# Patient Record
Sex: Female | Born: 1970 | Race: White | Hispanic: No | Smoking: Current every day smoker
Health system: Southern US, Community
[De-identification: ages and names within clinical notes are randomized; demographics above are authoritative.]

## PROBLEM LIST (undated history)

## (undated) DIAGNOSIS — T50902A Poisoning by unspecified drugs, medicaments and biological substances, intentional self-harm, initial encounter: Secondary | ICD-10-CM

## (undated) DIAGNOSIS — N200 Calculus of kidney: Secondary | ICD-10-CM

## (undated) DIAGNOSIS — F32A Depression, unspecified: Secondary | ICD-10-CM

## (undated) DIAGNOSIS — F329 Major depressive disorder, single episode, unspecified: Secondary | ICD-10-CM

## (undated) DIAGNOSIS — T71162A Asphyxiation due to hanging, intentional self-harm, initial encounter: Secondary | ICD-10-CM

## (undated) HISTORY — PX: APPENDECTOMY: SHX54

## (undated) HISTORY — PX: CHOLECYSTECTOMY: SHX55

---

## 2011-11-03 ENCOUNTER — Encounter: Payer: Self-pay | Admitting: *Deleted

## 2011-11-03 ENCOUNTER — Emergency Department (HOSPITAL_COMMUNITY)
Admission: EM | Admit: 2011-11-03 | Discharge: 2011-11-03 | Disposition: A | Discharge status: Home / Self Care | Payer: Self-pay | Attending: Emergency Medicine | Admitting: Emergency Medicine

## 2011-11-03 DIAGNOSIS — R51 Headache: Secondary | ICD-10-CM | POA: Insufficient documentation

## 2011-11-03 DIAGNOSIS — F329 Major depressive disorder, single episode, unspecified: Secondary | ICD-10-CM | POA: Insufficient documentation

## 2011-11-03 DIAGNOSIS — F3289 Other specified depressive episodes: Secondary | ICD-10-CM | POA: Insufficient documentation

## 2011-11-03 DIAGNOSIS — R443 Hallucinations, unspecified: Secondary | ICD-10-CM | POA: Insufficient documentation

## 2011-11-03 DIAGNOSIS — Z8659 Personal history of other mental and behavioral disorders: Secondary | ICD-10-CM | POA: Insufficient documentation

## 2011-11-03 DIAGNOSIS — R440 Auditory hallucinations: Secondary | ICD-10-CM

## 2011-11-03 DIAGNOSIS — Z79899 Other long term (current) drug therapy: Secondary | ICD-10-CM | POA: Insufficient documentation

## 2011-11-03 HISTORY — DX: Calculus of kidney: N20.0

## 2011-11-03 MED ORDER — BUPROPION HCL ER (SR) 100 MG PO TB12: 100.0000 mg | ORAL_TABLET | ORAL | Status: DC

## 2011-11-03 MED ORDER — ACETAMINOPHEN 325 MG PO TABS
650.0000 mg | ORAL_TABLET | Freq: Once | ORAL | Status: AC
  Administered 2011-11-03: 650 mg via ORAL
  Filled 2011-11-03 (×2): qty 1

## 2011-11-03 MED ORDER — LORAZEPAM 1 MG PO TABS: 0.5000 mg | ORAL_TABLET | Freq: Three times a day (TID) | ORAL | Status: AC

## 2011-11-03 MED ORDER — CHLORPROMAZINE HCL 50 MG PO TABS: 300.0000 mg | ORAL_TABLET | Freq: Three times a day (TID) | ORAL | Status: AC

## 2011-11-03 MED ORDER — LORAZEPAM 1 MG PO TABS
0.5000 mg | ORAL_TABLET | Freq: Once | ORAL | Status: AC
  Administered 2011-11-03: 0.5 mg via ORAL
  Filled 2011-11-03: qty 1

## 2011-11-03 NOTE — ED Provider Notes (Signed)
History     CSN: [id number] Arrival date & time: 11/03/2011 12:12 PM   First MD Initiated Contact with Patient 11/03/11 1357      Chief Complaint  Patient presents with  . Medical Clearance    (Consider location/radiation/quality/duration/timing/severity/associated sxs/prior treatment) HPI Patient complains of hallucinations for approximately 3 weeks,. She stopped taking her psychiatric medications 2 months ago. Auditory hallucinations. Has auditory hallucinations 03 men consulting her. One man telling her to hang herself, although patient started the amount he denies that she would harm herself or others. Also complains of diffuse headache which she gets when she's under emotional stress. Past Medical History  Diagnosis Date  . Kidney stones   . Migraine    Borderline personality disorder, schizophrenia History reviewed. No pertinent past surgical history.  History reviewed. No pertinent family history.  History  Substance Use Topics  . Smoking status: Current Everyday Smoker -- 1.0 packs/day  . Smokeless tobacco: Not on file  . Alcohol Use: No    OB History    Grav Para Term Preterm Abortions TAB SAB Ect Mult Living                  Review of Systems  Constitutional: Negative.   Respiratory: Negative.   Cardiovascular: Negative.   Gastrointestinal: Negative.   Musculoskeletal: Negative.   Skin: Negative.   Neurological: Positive for headaches.  Hematological: Negative.   Psychiatric/Behavioral: Positive for hallucinations.    Allergies  Review of patient's allergies indicates no known allergies.  Home Medications   Current Outpatient Rx  Name Route Sig Dispense Refill  . TRAMADOL HCL 50 MG PO TABS Oral Take 50 mg by mouth every 6 (six) hours as needed. Maximum dose= 8 tablets per day For pain       BP 131/91  Pulse 77  Temp(Src) 97.6 F (36.4 C) (Oral)  Resp 20  SpO2 100%  Physical Exam  Nursing note and vitals reviewed. Constitutional: She is  oriented to person, place, and time.  Neurological: She is oriented to person, place, and time. Coordination normal.  Psychiatric: Judgment normal.       Mildly depressed affect    ED Course  Procedures (including critical care time)  Labs Reviewed - No data to display No results found.   No diagnosis found.    MDM    Spoke with hospital pharmacist. Plan Prescription: Thorazine 300 mg 3 times daily#90 Wellbutrin 300 mg once daily#30 Ativan 0.5 mg 3 times a day as needed for anxiety#20    Referral [facility] Diagnosis auditory hallucinations  Doug Sou, MD 11/03/11 1432

## 2011-11-03 NOTE — ED Notes (Signed)
Reports being off psych meds x 2 months, hearing voices x 1 week, having hallucinations. Denies SI or HI.

## 2014-03-09 ENCOUNTER — Inpatient Hospital Stay: Payer: Self-pay | Admitting: Psychiatry

## 2014-10-13 ENCOUNTER — Encounter (HOSPITAL_COMMUNITY): Payer: Self-pay | Admitting: Emergency Medicine

## 2014-10-13 ENCOUNTER — Emergency Department (HOSPITAL_COMMUNITY): Payer: Self-pay

## 2014-10-13 ENCOUNTER — Emergency Department (HOSPITAL_COMMUNITY)
Admission: EM | Admit: 2014-10-13 | Discharge: 2014-10-14 | Disposition: A | Discharge status: Other Acute Inpatient Hospital | Payer: Self-pay | Attending: Emergency Medicine | Admitting: Emergency Medicine

## 2014-10-13 DIAGNOSIS — F32A Depression, unspecified: Secondary | ICD-10-CM | POA: Diagnosis present

## 2014-10-13 DIAGNOSIS — R0789 Other chest pain: Secondary | ICD-10-CM | POA: Insufficient documentation

## 2014-10-13 DIAGNOSIS — G43909 Migraine, unspecified, not intractable, without status migrainosus: Secondary | ICD-10-CM | POA: Insufficient documentation

## 2014-10-13 DIAGNOSIS — F209 Schizophrenia, unspecified: Secondary | ICD-10-CM | POA: Diagnosis present

## 2014-10-13 DIAGNOSIS — Z87442 Personal history of urinary calculi: Secondary | ICD-10-CM | POA: Insufficient documentation

## 2014-10-13 DIAGNOSIS — Z720 Tobacco use: Secondary | ICD-10-CM | POA: Insufficient documentation

## 2014-10-13 DIAGNOSIS — T50902A Poisoning by unspecified drugs, medicaments and biological substances, intentional self-harm, initial encounter: Secondary | ICD-10-CM | POA: Diagnosis present

## 2014-10-13 DIAGNOSIS — F329 Major depressive disorder, single episode, unspecified: Secondary | ICD-10-CM | POA: Insufficient documentation

## 2014-10-13 DIAGNOSIS — Z79899 Other long term (current) drug therapy: Secondary | ICD-10-CM | POA: Insufficient documentation

## 2014-10-13 DIAGNOSIS — F603 Borderline personality disorder: Secondary | ICD-10-CM | POA: Diagnosis present

## 2014-10-13 DIAGNOSIS — F1410 Cocaine abuse, uncomplicated: Secondary | ICD-10-CM | POA: Insufficient documentation

## 2014-10-13 HISTORY — DX: Asphyxiation due to hanging, intentional self-harm, initial encounter: T71.162A

## 2014-10-13 HISTORY — DX: Depression, unspecified: F32.A

## 2014-10-13 HISTORY — DX: Poisoning by unspecified drugs, medicaments and biological substances, intentional self-harm, initial encounter: T50.902A

## 2014-10-13 HISTORY — DX: Major depressive disorder, single episode, unspecified: F32.9

## 2014-10-13 LAB — RAPID URINE DRUG SCREEN, HOSP PERFORMED
AMPHETAMINES: NOT DETECTED
BARBITURATES: NOT DETECTED
BENZODIAZEPINES: NOT DETECTED
COCAINE: NOT DETECTED
OPIATES: POSITIVE — AB
TETRAHYDROCANNABINOL: NOT DETECTED

## 2014-10-13 LAB — COMPREHENSIVE METABOLIC PANEL
ALBUMIN: 3.3 g/dL — AB (ref 3.5–5.2)
ALK PHOS: 80 U/L (ref 39–117)
ALT: 17 U/L (ref 0–35)
ANION GAP: 12 (ref 5–15)
AST: 20 U/L (ref 0–37)
BUN: 13 mg/dL (ref 6–23)
CHLORIDE: 107 meq/L (ref 96–112)
CO2: 24 mEq/L (ref 19–32)
Calcium: 8.8 mg/dL (ref 8.4–10.5)
Creatinine, Ser: 0.73 mg/dL (ref 0.50–1.10)
GFR calc Af Amer: >90 mL/min (ref >90)
GFR calc non Af Amer: >90 mL/min (ref >90)
Glucose, Bld: 91 mg/dL (ref 70–99)
POTASSIUM: 3.7 meq/L (ref 3.7–5.3)
SODIUM: 143 meq/L (ref 137–147)
TOTAL PROTEIN: 7.5 g/dL (ref 6.0–8.3)

## 2014-10-13 LAB — SALICYLATE LEVEL: Salicylate Lvl: <2 mg/dL — ABNORMAL LOW (ref 2.8–20.0)

## 2014-10-13 LAB — CBC
HEMATOCRIT: 38.8 % (ref 36.0–46.0)
Hemoglobin: 12.8 g/dL (ref 12.0–15.0)
MCH: 31 pg (ref 26.0–34.0)
MCHC: 33 g/dL (ref 30.0–36.0)
MCV: 93.9 fL (ref 78.0–100.0)
Platelets: 263 10*3/uL (ref 150–400)
RBC: 4.13 MIL/uL (ref 3.87–5.11)
RDW: 14.4 % (ref 11.5–15.5)
WBC: 9.2 10*3/uL (ref 4.0–10.5)

## 2014-10-13 LAB — ETHANOL: Alcohol, Ethyl (B): <11 mg/dL (ref 0–11)

## 2014-10-13 LAB — ACETAMINOPHEN LEVEL: Acetaminophen (Tylenol), Serum: <15 ug/mL (ref 10–30)

## 2014-10-13 LAB — TROPONIN I: Troponin I: <0.3 ng/mL (ref <0.30)

## 2014-10-13 MED ORDER — ACETAMINOPHEN 500 MG PO TABS
1000.0000 mg | ORAL_TABLET | Freq: Once | ORAL | Status: AC
  Administered 2014-10-13: 1000 mg via ORAL
  Filled 2014-10-13: qty 2

## 2014-10-13 MED ORDER — SODIUM CHLORIDE 0.9 % IV BOLUS (SEPSIS)
1000.0000 mL | Freq: Once | INTRAVENOUS | Status: AC
  Administered 2014-10-13: 1000 mL via INTRAVENOUS

## 2014-10-13 MED ORDER — ACETAMINOPHEN 325 MG PO TABS: 650.0000 mg | ORAL_TABLET | ORAL | Status: DC | PRN

## 2014-10-13 MED ORDER — IBUPROFEN 200 MG PO TABS: 600.0000 mg | ORAL_TABLET | Freq: Three times a day (TID) | ORAL | Status: DC | PRN

## 2014-10-13 MED ORDER — ONDANSETRON HCL 4 MG/2ML IJ SOLN
4.0000 mg | Freq: Once | INTRAMUSCULAR | Status: AC
  Administered 2014-10-13: 4 mg via INTRAVENOUS
  Filled 2014-10-13: qty 2

## 2014-10-13 MED ORDER — NICOTINE 21 MG/24HR TD PT24: 21.0000 mg | MEDICATED_PATCH | Freq: Every day | TRANSDERMAL | Status: DC

## 2014-10-13 NOTE — ED Notes (Signed)
Per EMS pt took 10, 300 mg Seroquel in a suicide attempt.  Pt has multiple suicide attempts including trying to hang herself as well as liquefying all medications in her home and attempting to inject them into her veins resulting in poor venous access.  Pt endorsed chest tightness en route however 12 lead revealed NSR.  Pt is A&O x 3.  Per EMS pt has also been taking a family members morphine pills recently however did not take one today.

## 2014-10-13 NOTE — ED Notes (Signed)
Bed: ZO10WA16 Expected date:  Expected time:  Means of arrival:  Comments: EMS 43yo F overdose on Seroquel / chest pain / shob

## 2014-10-13 NOTE — ED Provider Notes (Signed)
CSN: [id number]     Arrival date & time 10/13/14  2058 History   First MD Initiated Contact with Patient 10/13/14 2102     Chief Complaint  Patient presents with  . Suicide Attempt     (Consider location/radiation/quality/duration/timing/severity/associated sxs/prior Treatment) HPI 43 year old female presents approximately 1-2 hours after intentionally taking 10 300 mg Seroquel's in a suicide attempt. Patient has attempted suicide multiple times. She states she is too depressed to live. She has been admitted to many psychiatric hospitals before. The patient states right now she feels tired and has "cottonmouth". The patient also feels nauseous but denies abdominal pain. Are partially 30 minutes after taking this she felt chest tightness. She states she thinks this was from stress from arguing with her boyfriend. She does not have any chest tightness at this time.  Past Medical History  Diagnosis Date  . Kidney stones   . Migraine   . Suicide attempt by hanging   . Suicide and self-inflicted poisoning by drugs and medicinal substances   . Depression    Past Surgical History  Procedure Laterality Date  . Cholecystectomy    . Appendectomy    . Cesarean section     History reviewed. No pertinent family history. History  Substance Use Topics  . Smoking status: Current Every Day Smoker -- 1.00 packs/day  . Smokeless tobacco: Not on file  . Alcohol Use: No   OB History   Grav Para Term Preterm Abortions TAB SAB Ect Mult Living                 Review of Systems  Respiratory: Positive for chest tightness.   Gastrointestinal: Positive for nausea. Negative for vomiting and abdominal pain.  Neurological: Negative for headaches.  Psychiatric/Behavioral: Positive for suicidal ideas and dysphoric mood.  All other systems reviewed and are negative.     Allergies  Review of patient's allergies indicates no known allergies.  Home Medications   Prior to Admission medications    Medication Sig Start Date End Date Taking? Authorizing Provider  buPROPion (WELLBUTRIN SR) 100 MG 12 hr tablet Take 1 tablet (100 mg total) by mouth 1 day or 1 dose. 11/03/11 11/02/12  Doug SouSam Jacubowitz, MD  traMADol (ULTRAM) 50 MG tablet Take 50 mg by mouth every 6 (six) hours as needed. Maximum dose= 8 tablets per day For pain     Historical Provider, MD   BP 125/71  Pulse 97  Temp(Src) 98.4 F (36.9 C) (Oral)  Resp 18  SpO2 98%  LMP 09/19/2014 Physical Exam  Nursing note and vitals reviewed. Constitutional: She is oriented to person, place, and time. She appears well-developed and well-nourished.  HENT:  Head: Normocephalic and atraumatic.  Right Ear: External ear normal.  Left Ear: External ear normal.  Nose: Nose normal.  Eyes: EOM are normal. Pupils are equal, round, and reactive to light. Right eye exhibits no discharge. Left eye exhibits no discharge.  Cardiovascular: Normal rate, regular rhythm and normal heart sounds.   Pulmonary/Chest: Effort normal and breath sounds normal.  Abdominal: Soft. She exhibits no distension. There is no tenderness.  Neurological: She is alert and oriented to person, place, and time. GCS eye subscore is 4. GCS verbal subscore is 5. GCS motor subscore is 6.  Reflex Scores:      Bicep reflexes are 2+ on the right side and 2+ on the left side.      Patellar reflexes are 2+ on the right side and 2+ on the left side.  CN 2-12 grossly intact. 5/5 strength in all 4 extremities, grossly normal sensation.  Skin: Skin is warm and dry.    ED Course  Procedures (including critical care time) Labs Review Labs Reviewed  COMPREHENSIVE METABOLIC PANEL - Abnormal; Notable for the following:    Albumin 3.3 (*)    Total Bilirubin <0.2 (*)    All other components within normal limits  URINE RAPID DRUG SCREEN (HOSP PERFORMED) - Abnormal; Notable for the following:    Opiates POSITIVE (*)    All other components within normal limits  SALICYLATE LEVEL -  Abnormal; Notable for the following:    Salicylate Lvl <2.0 (*)    All other components within normal limits  CBC  ETHANOL  ACETAMINOPHEN LEVEL  TROPONIN I  TROPONIN I    Imaging Review Dg Chest Port 1 View  10/13/2014   CLINICAL DATA:  Mid chest pain, smoker, intentional drug overdose.  EXAM: PORTABLE CHEST - 1 VIEW  COMPARISON:  01/17/2014  FINDINGS: Elevated hemidiaphragms obscure the lung bases. Allowing for this, mild bibasilar opacities. Mild interstitial and vascular crowding. Mild peribronchial thickening. No overt pleural effusion. No pneumothorax. Cardiomediastinal contours within normal range. No acute osseous finding.  IMPRESSION: Mild lung base opacities, favored to reflect atelectasis or scarring.  Mild peribronchial thickening may reflect acute and/or chronic bronchitis.   Electronically Signed   By: Jearld LeschAndrew  DelGaizo M.D.   On: 10/13/2014 21:44     EKG Interpretation   Date/Time:  Sunday October 13 2014 21:02:38 EDT Ventricular Rate:  92 PR Interval:  169 QRS Duration: 100 QT Interval:  373 QTC Calculation: 461 R Axis:   44 Text Interpretation:  Normal sinus rhythm Normal ECG No old tracing to  compare Confirmed by Darah Simkin  MD, Wynnie Pacetti ([id number]) on 10/13/2014 9:07:45 PM      Angiocath insertion Performed by: Pricilla LovelessGOLDSTON, Tirzah Fross T  Consent: Verbal consent obtained. Risks and benefits: risks, benefits and alternatives were discussed Time out: Immediately prior to procedure a "time out" was called to verify the correct patient, procedure, equipment, support staff and site/side marked as required.  Preparation: Patient was prepped and draped in the usual sterile fashion.  Vein Location: right basilic  Ultrasound Guided  Gauge: 20  Normal blood return and flush without difficulty Patient tolerance: Patient tolerated the procedure well with no immediate complications.    MDM   Final diagnoses:  Intentional drug overdose, initial encounter    Patient presents  after an intentional Seroquel overdose. Her vitals are stable and has a benign EKG and workup at this time. At this point she will be given fluids for symptomatically lightheadedness and will metabolize her overdose. Will need psychiatric consult and likely admission for her suicidal attempt.    Audree CamelScott T Elston Aldape, MD 10/13/14 [phone]

## 2014-10-13 NOTE — ED Notes (Signed)
Behavior Health worker at bedside speaking with patient.

## 2014-10-13 NOTE — BH Assessment (Signed)
Assessment completed. Consulted Janann Augustori Burkett, NP who recommended inpatient treatment. Dr. Criss AlvineGoldston has been informed of the recommendation.

## 2014-10-14 DIAGNOSIS — F603 Borderline personality disorder: Secondary | ICD-10-CM | POA: Diagnosis present

## 2014-10-14 DIAGNOSIS — F329 Major depressive disorder, single episode, unspecified: Secondary | ICD-10-CM | POA: Diagnosis present

## 2014-10-14 DIAGNOSIS — F209 Schizophrenia, unspecified: Secondary | ICD-10-CM | POA: Diagnosis present

## 2014-10-14 DIAGNOSIS — T71162A Asphyxiation due to hanging, intentional self-harm, initial encounter: Secondary | ICD-10-CM | POA: Insufficient documentation

## 2014-10-14 DIAGNOSIS — T50902A Poisoning by unspecified drugs, medicaments and biological substances, intentional self-harm, initial encounter: Secondary | ICD-10-CM | POA: Diagnosis present

## 2014-10-14 DIAGNOSIS — F32A Depression, unspecified: Secondary | ICD-10-CM | POA: Diagnosis present

## 2014-10-14 LAB — TROPONIN I

## 2014-10-14 NOTE — ED Notes (Signed)
Patient resting in position of comfort with eyes closed RR WNL--even and unlabored with equal rise and fall of chest Patient in NAD

## 2014-10-14 NOTE — ED Notes (Signed)
Report given to [facility] at [facility] called and made aware of need [facility] [place]

## 2014-10-14 NOTE — ED Notes (Signed)
[facility] [place] Psych treatment Will call report at [phone] Accepting MD: Dr. Tonita PhoenixKomissarova

## 2014-10-14 NOTE — ED Notes (Signed)
Patient belongings:  One pair of brown [employer] flip flops One light pink cotton sports bra One pair of denim pants One blue plaid shirt  Belongings placed under the Press photographerCharge Nurse desk
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
## 2014-10-14 NOTE — ED Notes (Signed)
[facility] given all Pt belongings prior to transport.

## 2014-10-14 NOTE — ED Notes (Signed)
Poison Control representative, Tresa EndoKelly, updated on patient's condition.  She recommends that she stay in the ED until 0800 for monitoring and suggests that if she becomes hypotensive without response to fluids to use Norepinephrine rather than other pressors.

## 2014-10-14 NOTE — [facility] Counselor (Addendum)
[facility] Dr Tonita PhoenixKomissarova is accepting pt. # for report is [phone]. Notified pt's RN Irving Burtonmily of disposition. Writer then spoke w/ pt who is [facility]. Irving BurtonEmily RN will contact [facility].   Evette Cristalaroline Paige Cathalina Barcia, LCSWA Assessment Counselor

## 2014-10-14 NOTE — ED Notes (Signed)
Patient now awake  Patient ambulated to bathroom with stand by assist from nursing staff Patient given breakfast Patient in NAD Will make EDP aware of patient status

## 2015-03-01 ENCOUNTER — Emergency Department (HOSPITAL_COMMUNITY)
Admission: EM | Admit: 2015-03-01 | Discharge: 2015-03-01 | Discharge status: Against Medical Advice | Payer: Self-pay | Attending: Emergency Medicine | Admitting: Emergency Medicine

## 2015-03-01 ENCOUNTER — Emergency Department (HOSPITAL_COMMUNITY): Payer: Self-pay

## 2015-03-01 DIAGNOSIS — Z9049 Acquired absence of other specified parts of digestive tract: Secondary | ICD-10-CM | POA: Insufficient documentation

## 2015-03-01 DIAGNOSIS — Z87828 Personal history of other (healed) physical injury and trauma: Secondary | ICD-10-CM | POA: Insufficient documentation

## 2015-03-01 DIAGNOSIS — Z8679 Personal history of other diseases of the circulatory system: Secondary | ICD-10-CM | POA: Insufficient documentation

## 2015-03-01 DIAGNOSIS — R1084 Generalized abdominal pain: Secondary | ICD-10-CM | POA: Insufficient documentation

## 2015-03-01 DIAGNOSIS — Z9889 Other specified postprocedural states: Secondary | ICD-10-CM | POA: Insufficient documentation

## 2015-03-01 DIAGNOSIS — R197 Diarrhea, unspecified: Secondary | ICD-10-CM | POA: Insufficient documentation

## 2015-03-01 DIAGNOSIS — Z720 Tobacco use: Secondary | ICD-10-CM | POA: Insufficient documentation

## 2015-03-01 DIAGNOSIS — M549 Dorsalgia, unspecified: Secondary | ICD-10-CM | POA: Insufficient documentation

## 2015-03-01 DIAGNOSIS — Z79899 Other long term (current) drug therapy: Secondary | ICD-10-CM | POA: Insufficient documentation

## 2015-03-01 DIAGNOSIS — Z87442 Personal history of urinary calculi: Secondary | ICD-10-CM | POA: Insufficient documentation

## 2015-03-01 DIAGNOSIS — Z3202 Encounter for pregnancy test, result negative: Secondary | ICD-10-CM | POA: Insufficient documentation

## 2015-03-01 LAB — COMPREHENSIVE METABOLIC PANEL
ALBUMIN: 3.7 g/dL (ref 3.5–5.2)
ALT: 95 U/L — ABNORMAL HIGH (ref 0–35)
ANION GAP: 8 (ref 5–15)
AST: 124 U/L — AB (ref 0–37)
Alkaline Phosphatase: 115 U/L (ref 39–117)
BUN: 17 mg/dL (ref 6–23)
CO2: 19 mmol/L (ref 19–32)
Calcium: 8.8 mg/dL (ref 8.4–10.5)
Chloride: 106 mmol/L (ref 96–112)
Creatinine, Ser: 0.95 mg/dL (ref 0.50–1.10)
GFR calc Af Amer: 83 mL/min — ABNORMAL LOW (ref >90)
GFR calc non Af Amer: 72 mL/min — ABNORMAL LOW (ref >90)
Glucose, Bld: 107 mg/dL — ABNORMAL HIGH (ref 70–99)
Potassium: 4 mmol/L (ref 3.5–5.1)
Sodium: 133 mmol/L — ABNORMAL LOW (ref 135–145)
TOTAL PROTEIN: 7.4 g/dL (ref 6.0–8.3)
Total Bilirubin: 0.7 mg/dL (ref 0.3–1.2)

## 2015-03-01 LAB — RAPID URINE DRUG SCREEN, HOSP PERFORMED
AMPHETAMINES: NOT DETECTED
Barbiturates: NOT DETECTED
Benzodiazepines: NOT DETECTED
COCAINE: NOT DETECTED
OPIATES: POSITIVE — AB
Tetrahydrocannabinol: NOT DETECTED

## 2015-03-01 LAB — CBC WITH DIFFERENTIAL/PLATELET
BASOS ABS: 0 10*3/uL (ref 0.0–0.1)
Basophils Relative: 0 % (ref 0–1)
EOS ABS: 0.1 10*3/uL (ref 0.0–0.7)
Eosinophils Relative: 0 % (ref 0–5)
HCT: 40.6 % (ref 36.0–46.0)
HEMOGLOBIN: 13.3 g/dL (ref 12.0–15.0)
Lymphocytes Relative: 4 % — ABNORMAL LOW (ref 12–46)
Lymphs Abs: 0.7 10*3/uL (ref 0.7–4.0)
MCH: 30.9 pg (ref 26.0–34.0)
MCHC: 32.8 g/dL (ref 30.0–36.0)
MCV: 94.2 fL (ref 78.0–100.0)
Monocytes Absolute: 0.7 10*3/uL (ref 0.1–1.0)
Monocytes Relative: 4 % (ref 3–12)
NEUTROS ABS: 16.7 10*3/uL — AB (ref 1.7–7.7)
Neutrophils Relative %: 92 % — ABNORMAL HIGH (ref 43–77)
PLATELETS: 183 10*3/uL (ref 150–400)
RBC: 4.31 MIL/uL (ref 3.87–5.11)
RDW: 14.4 % (ref 11.5–15.5)
WBC: 18.2 10*3/uL — ABNORMAL HIGH (ref 4.0–10.5)

## 2015-03-01 LAB — I-STAT CG4 LACTIC ACID, ED: Lactic Acid, Venous: 1.83 mmol/L (ref 0.5–2.0)

## 2015-03-01 LAB — LIPASE, BLOOD: LIPASE: 17 U/L (ref 11–59)

## 2015-03-01 LAB — POC URINE PREG, ED: Preg Test, Ur: NEGATIVE

## 2015-03-01 MED ORDER — GI COCKTAIL ~~LOC~~
30.0000 mL | Freq: Once | ORAL | Status: AC
  Administered 2015-03-01: 30 mL via ORAL
  Filled 2015-03-01: qty 30

## 2015-03-01 MED ORDER — ONDANSETRON 4 MG PO TBDP
4.0000 mg | ORAL_TABLET | Freq: Once | ORAL | Status: AC
  Administered 2015-03-01: 4 mg via ORAL
  Filled 2015-03-01: qty 1

## 2015-03-01 MED ORDER — SODIUM CHLORIDE 0.9 % IV BOLUS (SEPSIS)
1000.0000 mL | Freq: Once | INTRAVENOUS | Status: AC
  Administered 2015-03-01: 1000 mL via INTRAVENOUS

## 2015-03-01 MED ORDER — KETOROLAC TROMETHAMINE 30 MG/ML IJ SOLN
30.0000 mg | Freq: Once | INTRAMUSCULAR | Status: DC
  Filled 2015-03-01: qty 1

## 2015-03-01 MED ORDER — DICYCLOMINE HCL 10 MG PO CAPS
20.0000 mg | ORAL_CAPSULE | Freq: Once | ORAL | Status: AC
  Administered 2015-03-01: 20 mg via ORAL
  Filled 2015-03-01: qty 2

## 2015-03-01 MED ORDER — IOHEXOL 300 MG/ML  SOLN
100.0000 mL | Freq: Once | INTRAMUSCULAR | Status: AC | PRN
  Administered 2015-03-01: 100 mL via INTRAVENOUS

## 2015-03-01 NOTE — ED Notes (Signed)
Pt from home via EMS-Per EMS, pt sts that she was walking to the store when she had sudden onset lower back pain that radiates through to umbilicus. Pt denies hx of the same. Pt has had appy and gall bladder removed. Pt is ambulatory and A&O. Pt in NAD

## 2015-03-01 NOTE — ED Notes (Signed)
Bed: WA08 Expected date: 03/01/15 Expected time: 5:11 PM Means of arrival: Ambulance Comments: Back Pain

## 2015-03-01 NOTE — ED Notes (Signed)
Pt demanding to leave AMA; pt states "Ilsa IhaYa'll are not doing anything for me"; pt refused VS and refused to sign; IV dc'd and pt walked out with family

## 2015-03-01 NOTE — ED Provider Notes (Signed)
CSN: [id number]     Arrival date & time 03/01/15  1717 History   First MD Initiated Contact with Patient 03/01/15 1722     Chief Complaint  Patient presents with  . Back Pain  . Abdominal Pain   Jessica Mckinney is a 44 y.o. female with a history of personality disorder, chronic back pain, depression and suicide attempts presents to the ED complaining of sudden onset of worsening back pain that radiates to her stomach around her umbilicus.  She reports her symptoms began approximately 3 hours ago. She reports her pain comes in waves and she rates her pain a 10 out of 10 and describes it as stabbing and burning. She reports having 1 loose stool about an hour ago. She was a previous abdominal surgical history including appendectomy, cholecystectomy, and cesarean section. The patient denies fevers, chills, dysuria, hematuria, urinary frequency, urinary urgency, nausea, vomiting, chest pain, shortness of breath or weakness. The patient comes by EMS from [facility] today. Patient reports taking oxycodone today for the cyst in her back.    (Consider location/radiation/quality/duration/timing/severity/associated sxs/prior Treatment) HPI  Past Medical History  Diagnosis Date  . Kidney stones   . Migraine   . Suicide attempt by hanging   . Suicide and self-inflicted poisoning by drugs and medicinal substances   . Depression    Past Surgical History  Procedure Laterality Date  . Cholecystectomy    . Appendectomy    . Cesarean section     No family history on file. History  Substance Use Topics  . Smoking status: Current Every Day Smoker -- 1.00 packs/day  . Smokeless tobacco: Not on file  . Alcohol Use: No   OB History    No data available     Review of Systems  Constitutional: Negative for fever and chills.  HENT: Negative for congestion and sore throat.   Eyes: Negative for visual disturbance.  Respiratory: Negative for cough, shortness of breath and wheezing.   Cardiovascular:  Negative for chest pain and palpitations.  Gastrointestinal: Positive for abdominal pain and diarrhea. Negative for nausea and vomiting.  Genitourinary: Negative for dysuria, urgency, frequency, hematuria, flank pain and difficulty urinating.  Musculoskeletal: Positive for back pain. Negative for neck pain.  Skin: Negative for rash and wound.  Neurological: Negative for dizziness, weakness, light-headedness and headaches.      Allergies  Toradol and Tramadol  Home Medications   Prior to Admission medications   Medication Sig Start Date End Date Taking? Authorizing Provider  acetaminophen (TYLENOL) 500 MG tablet Take 1,000 mg by mouth every 6 (six) hours as needed for mild pain or moderate pain (pain).    Yes Historical Provider, MD  ibuprofen (ADVIL,MOTRIN) 200 MG tablet Take 800 mg by mouth every 6 (six) hours as needed for moderate pain (pain).   Yes Historical Provider, MD  Oxycodone HCl 10 MG TABS Take 10 mg by mouth 2 (two) times daily.   Yes Historical Provider, MD  QUEtiapine (SEROQUEL) 200 MG tablet Take 200 mg by mouth at bedtime. Take along with 300 mg tablet at bedtime.   Yes Historical Provider, MD  QUEtiapine (SEROQUEL) 300 MG tablet Take 300 mg by mouth at bedtime. Take along with 200 mg tablet at bedtime.   Yes Historical Provider, MD  sertraline (ZOLOFT) 100 MG tablet Take 100 mg by mouth daily.   Yes Historical Provider, MD  zolpidem (AMBIEN) 10 MG tablet Take 10 mg by mouth at bedtime as needed for sleep (sleep).  Yes Historical Provider, MD   BP 107/62 mmHg  Pulse 71  Temp(Src) 98.1 F (36.7 C) (Oral)  Resp 16  SpO2 100%  LMP 02/18/2015 Physical Exam  Constitutional: She appears well-developed and well-nourished. No distress.  Non-toxic appearing. Track marks on arms from suspected iv drug use.   HENT:  Head: Normocephalic and atraumatic.  Right Ear: External ear normal.  Left Ear: External ear normal.  Nose: Nose normal.  Mouth/Throat: Oropharynx is clear  and moist. No oropharyngeal exudate.  Eyes: Conjunctivae are normal. Pupils are equal, round, and reactive to light. Right eye exhibits no discharge. Left eye exhibits no discharge.  Neck: Normal range of motion. Neck supple.  Cardiovascular: Normal rate, regular rhythm, normal heart sounds and intact distal pulses.  Exam reveals no gallop and no friction rub.   No murmur heard. Bilateral radial, posterior tibialis and dorsalis pedis pulses are intact.   Pulmonary/Chest: Effort normal and breath sounds normal. No respiratory distress. She has no wheezes. She has no rales. She exhibits no tenderness.  Abdominal: Soft. Bowel sounds are normal. She exhibits no distension and no mass. There is tenderness. There is no rebound and no guarding.  abdomen is mildly tender to palpation across her upper abdomen. No focal tenderness. Negative psoas and obturator sign. Bowel sounds are present. Abdomen is soft.  Musculoskeletal: She exhibits no edema.  Lymphadenopathy:    She has no cervical adenopathy.  Neurological: She is alert. Coordination normal.  Skin: Skin is warm and dry. No rash noted. She is not diaphoretic. No erythema. No pallor.  Psychiatric: She has a normal mood and affect. Her behavior is normal.  Nursing note and vitals reviewed.   ED Course  Procedures (including critical care time) Labs Review Labs Reviewed  COMPREHENSIVE METABOLIC PANEL - Abnormal; Notable for the following:    Sodium 133 (*)    Glucose, Bld 107 (*)    AST 124 (*)    ALT 95 (*)    GFR calc non Af Amer 72 (*)    GFR calc Af Amer 83 (*)    All other components within normal limits  CBC WITH DIFFERENTIAL/PLATELET - Abnormal; Notable for the following:    WBC 18.2 (*)    Neutrophils Relative % 92 (*)    Neutro Abs 16.7 (*)    Lymphocytes Relative 4 (*)    All other components within normal limits  URINE RAPID DRUG SCREEN (HOSP PERFORMED) - Abnormal; Notable for the following:    Opiates POSITIVE (*)    All  other components within normal limits  LIPASE, BLOOD  POC URINE PREG, ED  I-STAT CG4 LACTIC ACID, ED    Imaging Review Ct Abdomen Pelvis W Contrast  03/01/2015   CLINICAL DATA:  Sudden onset of lower back pain, radiating to the umbilicus. Nausea and vomiting. Initial encounter.  EXAM: CT ABDOMEN AND PELVIS WITH CONTRAST  TECHNIQUE: Multidetector CT imaging of the abdomen and pelvis was performed using the standard protocol following bolus administration of intravenous contrast.  CONTRAST:  OMNIPAQUE IOHEXOL 300 MG/ML  SOLN  COMPARISON:  Lumbar spine radiographs performed 08/26/2014, and CT of the abdomen and pelvis performed 02/14/2013  FINDINGS: The visualized lung bases are clear.  A tiny hiatal hernia is noted.  Mild prominence of the intrahepatic biliary ducts remains within normal limits status post cholecystectomy. The liver is otherwise unremarkable. Clips are seen at the gallbladder fossa. The spleen is unremarkable in appearance. The pancreas and adrenal glands are unremarkable.  The kidneys are unremarkable in appearance. There is no evidence of hydronephrosis. No renal or ureteral stones are seen. No perinephric stranding is appreciated.  No free fluid is identified. The small bowel is unremarkable in appearance. The stomach is within normal limits. No acute vascular abnormalities are seen.  The patient is status post appendectomy. Mild diverticulosis is noted along the entirety of the colon, without evidence of diverticulitis. The colon is otherwise unremarkable in appearance.  The bladder is mildly distended and grossly unremarkable. The uterus is within normal limits. The ovaries are relatively symmetric. No suspicious adnexal masses are seen. No inguinal lymphadenopathy is seen.  No acute osseous abnormalities are identified. There appears to be an elongated cyst along the right side of the spinal canal at L1, measuring approximately 3.5 x 1.9 cm. 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  IMPRESSION: 1. No acute abnormality seen within the abdomen or pelvis. 2. Mild diverticulosis noted along the entirety of the colon, without evidence of diverticulitis. 3. Apparent elongated cyst along the right side of the spinal canal at L1, measuring approximately 3.5 x 1.9 cm. 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 MRI of the lumbar spine could be considered for further evaluation, as deemed clinically appropriate, especially of the patient is symptomatic. 4. Tiny hiatal hernia noted.   Electronically Signed   By: Roanna Raider M.D.   On: 03/01/2015 20:39     EKG Interpretation None      Filed Vitals:   03/01/15 1718 03/01/15 1719 03/01/15 2003  BP: 90/54  107/62  Pulse: 97  71  Temp: 98.1 F (36.7 C)    TempSrc: Oral    Resp: 20  16  SpO2: 97% 97% 100%     MDM   Meds given in ED:  Medications  ketorolac (TORADOL) 30 MG/ML injection 30 mg (30 mg Intravenous Not Given 03/01/15 1827)  sodium chloride 0.9 % bolus 1,000 mL (0 mLs Intravenous Stopped 03/01/15 1900)  gi cocktail (Maalox,Lidocaine,Donnatal) (30 mLs Oral Given 03/01/15 1853)  iohexol (OMNIPAQUE) 300 MG/ML solution 100 mL (100 mLs Intravenous Contrast Given 03/01/15 2019)  dicyclomine (BENTYL) capsule 20 mg (20 mg Oral Given 03/01/15 2109)  ondansetron (ZOFRAN-ODT) disintegrating tablet 4 mg (4 mg Oral Given 03/01/15 2109)    Discharge Medication List as of 03/01/2015  9:34 PM      Final diagnoses:  Generalized abdominal pain   This  is a 44 y.o. female with a history of personality disorder, chronic back pain, depression and suicide attempts presents to the ED complaining of sudden onset of worsening back pain that radiates to her stomach around her umbilicus.  She reports her symptoms began approximately 3 hours ago. She reports a previous  appendectomy and cholecystectomy. Patient is afebrile and nontoxic-appearing. The patient is generally tender across her upper abdomen around her umbilicus.  No focal tenderness. She has a negative urine pregnancy test. She is a normal lactic acid. CMP indicates mildly elevated liver enzymes. Patient denies use of Tylenol recently. Lipase is negative. CBC indicates an elevated white count of 18.2. CT of her abdomen and pelvis indicated no acute abnormality.  The patient reports some improvement with GI cocktail. She later requests more and will try bentyl and zofran. Toradol was offered for pain, but she reports an allergy after stating no  allergies previously to me. I informed her of her negative CT.   Patient is looked up on the [place] controlled substance database which indicated she filled a prescription for oxycodone 10 mg and received 30 day supply on 02/04/2015.   I was later informed that the patient had left AMA. I was not made aware of the patient wanting to leave and I did not get a chance to speak to her prior to her leaving.   This patient was discussed with Dr. Anitra Lauth who agrees with assessment and plan.    Everlene Farrier, PA-C 03/02/15 4098  Gwyneth Sprout, MD 03/03/15 [phone]

## 2015-03-01 NOTE — ED Notes (Signed)
Attempted IV access x2 w/o success. Primary RN notified

## 2015-03-01 NOTE — ED Notes (Signed)
PA Will at bedside.  

## 2015-03-02 ENCOUNTER — Emergency Department (HOSPITAL_COMMUNITY): Payer: Self-pay

## 2015-03-02 ENCOUNTER — Encounter (HOSPITAL_COMMUNITY): Payer: Self-pay | Admitting: Nurse Practitioner

## 2015-03-02 ENCOUNTER — Emergency Department (HOSPITAL_COMMUNITY)
Admission: EM | Admit: 2015-03-02 | Discharge: 2015-03-02 | Disposition: A | Discharge status: Home / Self Care | Payer: Self-pay | Attending: Emergency Medicine | Admitting: Emergency Medicine

## 2015-03-02 DIAGNOSIS — Z720 Tobacco use: Secondary | ICD-10-CM | POA: Insufficient documentation

## 2015-03-02 DIAGNOSIS — G8929 Other chronic pain: Secondary | ICD-10-CM | POA: Insufficient documentation

## 2015-03-02 DIAGNOSIS — M549 Dorsalgia, unspecified: Secondary | ICD-10-CM | POA: Insufficient documentation

## 2015-03-02 DIAGNOSIS — Z79899 Other long term (current) drug therapy: Secondary | ICD-10-CM | POA: Insufficient documentation

## 2015-03-02 DIAGNOSIS — Z79891 Long term (current) use of opiate analgesic: Secondary | ICD-10-CM | POA: Insufficient documentation

## 2015-03-02 DIAGNOSIS — Z87442 Personal history of urinary calculi: Secondary | ICD-10-CM | POA: Insufficient documentation

## 2015-03-02 DIAGNOSIS — Z9049 Acquired absence of other specified parts of digestive tract: Secondary | ICD-10-CM | POA: Insufficient documentation

## 2015-03-02 DIAGNOSIS — Z8669 Personal history of other diseases of the nervous system and sense organs: Secondary | ICD-10-CM | POA: Insufficient documentation

## 2015-03-02 DIAGNOSIS — F329 Major depressive disorder, single episode, unspecified: Secondary | ICD-10-CM | POA: Insufficient documentation

## 2015-03-02 DIAGNOSIS — R197 Diarrhea, unspecified: Secondary | ICD-10-CM | POA: Insufficient documentation

## 2015-03-02 LAB — COMPREHENSIVE METABOLIC PANEL
ALBUMIN: 3.2 g/dL — AB (ref 3.5–5.2)
ALK PHOS: 81 U/L (ref 39–117)
ALT: 62 U/L — ABNORMAL HIGH (ref 0–35)
AST: 48 U/L — ABNORMAL HIGH (ref 0–37)
Anion gap: 7 (ref 5–15)
BILIRUBIN TOTAL: 0.4 mg/dL (ref 0.3–1.2)
BUN: 7 mg/dL (ref 6–23)
CALCIUM: 8.8 mg/dL (ref 8.4–10.5)
CHLORIDE: 107 mmol/L (ref 96–112)
CO2: 24 mmol/L (ref 19–32)
Creatinine, Ser: 0.68 mg/dL (ref 0.50–1.10)
GFR calc Af Amer: >90 mL/min (ref >90)
Glucose, Bld: 98 mg/dL (ref 70–99)
POTASSIUM: 3.7 mmol/L (ref 3.5–5.1)
SODIUM: 138 mmol/L (ref 135–145)
Total Protein: 8.1 g/dL (ref 6.0–8.3)

## 2015-03-02 LAB — CBC WITH DIFFERENTIAL/PLATELET
BASOS ABS: 0 10*3/uL (ref 0.0–0.1)
BASOS PCT: 0 % (ref 0–1)
Eosinophils Absolute: 0.1 10*3/uL (ref 0.0–0.7)
Eosinophils Relative: 1 % (ref 0–5)
HCT: 38.4 % (ref 36.0–46.0)
Hemoglobin: 12.8 g/dL (ref 12.0–15.0)
LYMPHS ABS: 2.2 10*3/uL (ref 0.7–4.0)
Lymphocytes Relative: 17 % (ref 12–46)
MCH: 30.2 pg (ref 26.0–34.0)
MCHC: 33.3 g/dL (ref 30.0–36.0)
MCV: 90.6 fL (ref 78.0–100.0)
Monocytes Absolute: 1.2 10*3/uL — ABNORMAL HIGH (ref 0.1–1.0)
Monocytes Relative: 9 % (ref 3–12)
NEUTROS ABS: 9 10*3/uL — AB (ref 1.7–7.7)
Neutrophils Relative %: 73 % (ref 43–77)
PLATELETS: 185 10*3/uL (ref 150–400)
RBC: 4.24 MIL/uL (ref 3.87–5.11)
RDW: 14.6 % (ref 11.5–15.5)
WBC: 12.4 10*3/uL — AB (ref 4.0–10.5)

## 2015-03-02 LAB — LIPASE, BLOOD: LIPASE: 20 U/L (ref 11–59)

## 2015-03-02 LAB — URINALYSIS, ROUTINE W REFLEX MICROSCOPIC
BILIRUBIN URINE: NEGATIVE
Glucose, UA: NEGATIVE mg/dL
Ketones, ur: 15 mg/dL — AB
LEUKOCYTES UA: NEGATIVE
Nitrite: NEGATIVE
Protein, ur: NEGATIVE mg/dL
SPECIFIC GRAVITY, URINE: 1.018 (ref 1.005–1.030)
Urobilinogen, UA: 0.2 mg/dL (ref 0.0–1.0)
pH: 7 (ref 5.0–8.0)

## 2015-03-02 LAB — URINE MICROSCOPIC-ADD ON

## 2015-03-02 MED ORDER — GADOBENATE DIMEGLUMINE 529 MG/ML IV SOLN
20.0000 mL | Freq: Once | INTRAVENOUS | Status: AC | PRN
  Administered 2015-03-02: 20 mL via INTRAVENOUS

## 2015-03-02 MED ORDER — MORPHINE SULFATE 4 MG/ML IJ SOLN
4.0000 mg | Freq: Once | INTRAMUSCULAR | Status: AC
Start: 2015-03-02 — End: 2015-03-02
  Administered 2015-03-02: 4 mg via INTRAVENOUS
  Filled 2015-03-02: qty 1

## 2015-03-02 MED ORDER — MORPHINE SULFATE 4 MG/ML IJ SOLN: 4.0000 mg | Freq: Once | INTRAMUSCULAR | Status: DC

## 2015-03-02 MED ORDER — MORPHINE SULFATE 4 MG/ML IJ SOLN
4.0000 mg | Freq: Once | INTRAMUSCULAR | Status: AC
  Administered 2015-03-02: 4 mg via INTRAVENOUS
  Filled 2015-03-02: qty 1

## 2015-03-02 MED ORDER — ORPHENADRINE CITRATE ER 100 MG PO TB12: 100.0000 mg | ORAL_TABLET | Freq: Two times a day (BID) | ORAL | Status: DC

## 2015-03-02 MED ORDER — ALUM & MAG HYDROXIDE-SIMETH 200-200-20 MG/5ML PO SUSP
30.0000 mL | Freq: Once | ORAL | Status: AC
  Administered 2015-03-02: 30 mL via ORAL
  Filled 2015-03-02: qty 30

## 2015-03-02 MED ORDER — OXYCODONE-ACETAMINOPHEN 5-325 MG PO TABS
2.0000 | ORAL_TABLET | Freq: Once | ORAL | Status: AC
  Administered 2015-03-02: 2 via ORAL
  Filled 2015-03-02: qty 2

## 2015-03-02 MED ORDER — LIDOCAINE VISCOUS 2 % MT SOLN
15.0000 mL | Freq: Once | OROMUCOSAL | Status: AC
  Administered 2015-03-02: 15 mL via OROMUCOSAL
  Filled 2015-03-02: qty 15

## 2015-03-02 NOTE — ED Provider Notes (Signed)
CSN: [id number]     Arrival date & time 03/02/15  1255 History   First MD Initiated Contact with Patient 03/02/15 1522     Chief Complaint  Patient presents with  . Abdominal Pain     (Consider location/radiation/quality/duration/timing/severity/associated sxs/prior Treatment) HPI  Jessica Mckinney is a 44 year old female past medical history previous suicide attempts depression renal stones migraines previous cholecystectomy appendectomy. Was seen yesterday at [facility] because of epigastric abdominal pain and back pain. During that emergency room visit she had a CT abdomen and pelvis as well as CBC and lipase UA U protocol which were unremarkable.  Since discharge she's been continuing to have back pain which she says is associated with this epigastric abdominal pain which she describes as burning and worse after eating.  There are no alleviating factors to the abdominal pain.  The back pain radiates down both of her legs starts in the mid back midline. She has a known cyst near her spine she denies any saddle anesthesia, urinary retention, she has had some difficulty holding her stool but she also notes that she's had mild diarrhea w/o blood. She has a hx of IV drug use   Past Medical History  Diagnosis Date  . Kidney stones   . Migraine   . Suicide attempt by hanging   . Suicide and self-inflicted poisoning by drugs and medicinal substances   . Depression    Past Surgical History  Procedure Laterality Date  . Cholecystectomy    . Appendectomy    . Cesarean section     History reviewed. No pertinent family history. History  Substance Use Topics  . Smoking status: Current Every Day Smoker -- 1.00 packs/day  . Smokeless tobacco: Not on file  . Alcohol Use: No   OB History    No data available     Review of Systems  Constitutional: Negative for fever and chills.  HENT: Negative for nosebleeds.   Eyes: Negative for visual disturbance.  Respiratory: Negative for cough and  shortness of breath.   Cardiovascular: Negative for chest pain.  Gastrointestinal: Positive for abdominal pain and diarrhea. Negative for nausea, vomiting and constipation.  Genitourinary: Negative for dysuria.  Musculoskeletal: Positive for back pain.  Skin: Negative for rash.  Neurological: Negative for weakness.  All other systems reviewed and are negative.     Allergies  Toradol and Tramadol  Home Medications   Prior to Admission medications   Medication Sig Start Date End Date Taking? Authorizing Provider  acetaminophen (TYLENOL) 500 MG tablet Take 1,000 mg by mouth every 6 (six) hours as needed for mild pain or moderate pain (pain).     Historical Provider, MD  ibuprofen (ADVIL,MOTRIN) 200 MG tablet Take 800 mg by mouth every 6 (six) hours as needed for moderate pain (pain).    Historical Provider, MD  Oxycodone HCl 10 MG TABS Take 10 mg by mouth 2 (two) times daily.    Historical Provider, MD  QUEtiapine (SEROQUEL) 200 MG tablet Take 200 mg by mouth at bedtime. Take along with 300 mg tablet at bedtime.    Historical Provider, MD  QUEtiapine (SEROQUEL) 300 MG tablet Take 300 mg by mouth at bedtime. Take along with 200 mg tablet at bedtime.    Historical Provider, MD  sertraline (ZOLOFT) 100 MG tablet Take 100 mg by mouth daily.    Historical Provider, MD  zolpidem (AMBIEN) 10 MG tablet Take 10 mg by mouth at bedtime as needed for sleep (sleep).  Historical Provider, MD   BP 116/82 mmHg  Pulse 98  Temp(Src) 98.4 F (36.9 C) (Oral)  Resp 12  SpO2 100%  LMP 02/18/2015 Physical Exam  Constitutional: She is oriented to person, place, and time. No distress.  HENT:  Head: Normocephalic and atraumatic.  Eyes: EOM are normal. Pupils are equal, round, and reactive to light.  Neck: Normal range of motion. Neck supple.  Cardiovascular: Normal rate and intact distal pulses.   Pulmonary/Chest: No respiratory distress.  Abdominal: Soft. There is tenderness (epigastric). There is no  rebound and no guarding.  Genitourinary:  Normal rectal tone  Musculoskeletal: Normal range of motion.  Neurological: She is alert and oriented to person, place, and time.  Skin: No rash noted. She is not diaphoretic.  Psychiatric: She has a normal mood and affect.    ED Course  Procedures (including critical care time) Labs Review Labs Reviewed - No data to display  Imaging Review Ct Abdomen Pelvis W Contrast  03/01/2015   CLINICAL DATA:  Sudden onset of lower back pain, radiating to the umbilicus. Nausea and vomiting. Initial encounter.  EXAM: CT ABDOMEN AND PELVIS WITH CONTRAST  TECHNIQUE: Multidetector CT imaging of the abdomen and pelvis was performed using the standard protocol following bolus administration of intravenous contrast.  CONTRAST:  100mL OMNIPAQUE IOHEXOL 300 MG/ML  SOLN  COMPARISON:  Lumbar spine radiographs performed 08/26/2014, and CT of the abdomen and pelvis performed 02/14/2013  FINDINGS: The visualized lung bases are clear.  A tiny hiatal hernia is noted.  Mild prominence of the intrahepatic biliary ducts remains within normal limits status post cholecystectomy. The liver is otherwise unremarkable. Clips are seen at the gallbladder fossa. The spleen is unremarkable in appearance. The pancreas and adrenal glands are unremarkable.  The kidneys are unremarkable in appearance. There is no evidence of hydronephrosis. No renal or ureteral stones are seen. No perinephric stranding is appreciated.  No free fluid is identified. The small bowel is unremarkable in appearance. The stomach is within normal limits. No acute vascular abnormalities are seen.  The patient is status post appendectomy. Mild diverticulosis is noted along the entirety of the colon, without evidence of diverticulitis. The colon is otherwise unremarkable in appearance.  The bladder is mildly distended and grossly unremarkable. The uterus is within normal limits. The ovaries are relatively symmetric. No suspicious  adnexal masses are seen. No inguinal lymphadenopathy is seen.  No acute osseous abnormalities are identified. There appears to be an elongated cyst along the right side of the spinal canal at L1, measuring approximately 3.5 x 1.9 cm. 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  IMPRESSION: 1. No acute abnormality seen within the abdomen or pelvis. 2. Mild diverticulosis noted along the entirety of the colon, without evidence of diverticulitis. 3. Apparent elongated cyst along the right side of the spinal canal at L1, measuring approximately 3.5 x 1.9 cm. 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 MRI of the lumbar spine could be considered for further evaluation, as deemed clinically appropriate, especially of the patient is symptomatic. 4. Tiny hiatal hernia noted.   Electronically Signed   By: Roanna RaiderJeffery  Chang M.D.   On: 03/01/2015 20:39     EKG Interpretation None      MDM   Final diagnoses:  None   Procedure note: Ultrasound Guided Peripheral  IV Ultrasound guided 20 g peripheral 1.88 inch angiocath IV placement performed by me. Indications: Nursing unable to place IV. Details: The left antecubital fossa and upper arm was evaluated with a multifrequency linear probe. Several patent brachial veins are noted. 1 attempts were made to cannulate a Left vein under realtime US guidance with successful cannulation of the vein and catheter placement. There is return of non-pulsatile dark red blood. The patient tolerated the procedure well without complications. An ultrasound image is not archived.   MDM This is a 44 year old female with past medical history spinal cyst, depression, suicide attempts, chronic back pain  She presents with back pain and epigastric abdominal pain which she's been having for last few days. She was seen  yesterday at [facility] for the same. Yesterday, abdominal labs and CT abdomen and pelvis were obtained and were unremarkable. She's been continuing to have pain  Exam as above she is afebrile here she is hemodynamically stable, she has mild tenderness to palpation in the epigastric area. She has normal strength in the bilateral lower extremities she has normal rectal tone on exam, will obtain postvoid residual to rule out cauda equina.  Patient admits to IVDU which raises some concern for possible epidural abscess, especially considering a mild leukocytosis at [place] yesterday.  Given this, will obtain MR thoracic/lumbar spine w/wo to look for abscess or other mass lesion.  Will also repeat cbc/cmp.lipase.  Feel no need to repeat abdominal imaging given normal CT abdomen/pelvis yesterday and no changes in pain since then.  MR shows no cord compression or abscess or other infectious process.  Labs reviewed and unremarkable.  Feel safe for d/c w/ f/u w/ spine surgeon.  I have discussed the results, Dx and Tx plan with the patient. They expressed understanding and agree with the plan and were told to return to ED with any worsening of condition or concern.    Disposition: Discharge  Condition: Good  Discharge Medication List as of 03/02/2015  7:36 PM      Follow Up: Barnett Abu, MD [address] [place] [address] [phone]   to have your cyst evaluated   Pt seen in conjunction with Dr. Meredith Pel, MD 03/03/15 [id number]  Zadie Rhine, MD 03/03/15 2232

## 2015-03-02 NOTE — ED Notes (Addendum)
Pt states her abd feels like its on fire. Pt states pain starts in her back and wraps around her abd

## 2015-03-02 NOTE — ED Provider Notes (Signed)
Patient seen/examined in the Emergency Department in conjunction with Resident Physician Provider [name] Patient reports worsening back pain Exam : awake/alert, appears uncomfortable in the bed.  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  On ER visit yesterday she had elevated WBC.  Will order MR thoracic/lumbar with contrast   Jessica Mckinney Jessica Malmberg, MD 03/02/15 1600

## 2015-03-02 NOTE — ED Notes (Signed)
MD at bedside with ultrasound IV

## 2015-03-02 NOTE — ED Notes (Signed)
Post void residual 90 mL

## 2015-03-02 NOTE — Discharge Instructions (Signed)
Back Pain, Adult Low back pain is very common. About 1 in 5 people have back pain.The cause of low back pain is rarely dangerous. The pain often gets better over time.About half of people with a sudden onset of back pain feel better in just 2 weeks. About 8 in 10 people feel better by 6 weeks.  CAUSES Some common causes of back pain include:  Strain of the muscles or ligaments supporting the spine.  Wear and tear (degeneration) of the spinal discs.  Arthritis.  Direct injury to the back. DIAGNOSIS Most of the time, the direct cause of low back pain is not known.However, back pain can be treated effectively even when the exact cause of the pain is unknown.Answering your caregiver's questions about your overall health and symptoms is one of the most accurate ways to make sure the cause of your pain is not dangerous. If your caregiver needs more information, he or she may order lab work or imaging tests (X-rays or MRIs).However, even if imaging tests show changes in your back, this usually does not require surgery. HOME CARE INSTRUCTIONS For many people, back pain returns.Since low back pain is rarely dangerous, it is often a condition that people can learn to manageon their own.   Remain active. It is stressful on the back to sit or stand in one place. Do not sit, drive, or stand in one place for more than 30 minutes at a time. Take short walks on level surfaces as soon as pain allows.Try to increase the length of time you walk each day.  Do not stay in bed.Resting more than 1 or 2 days can delay your recovery.  Do not avoid exercise or work.Your body is made to move.It is not dangerous to be active, even though your back may hurt.Your back will likely heal faster if you return to being active before your pain is gone.  Pay attention to your body when you bend and lift. Many people have less discomfortwhen lifting if they bend their knees, keep the load close to their bodies,and  avoid twisting. Often, the most comfortable positions are those that put less stress on your recovering back.  Find a comfortable position to sleep. Use a firm mattress and lie on your side with your knees slightly bent. If you lie on your back, put a pillow under your knees.  Only take over-the-counter or prescription medicines as directed by your caregiver. Over-the-counter medicines to reduce pain and inflammation are often the most helpful.Your caregiver may prescribe muscle relaxant drugs.These medicines help dull your pain so you can more quickly return to your normal activities and healthy exercise.  Put ice on the injured area.  Put ice in a plastic bag.  Place a towel between your skin and the bag.  Leave the ice on for 15-20 minutes, 03-04 times a day for the first 2 to 3 days. After that, ice and heat may be alternated to reduce pain and spasms.  Ask your caregiver about trying back exercises and gentle massage. This may be of some benefit.  Avoid feeling anxious or stressed.Stress increases muscle tension and can worsen back pain.It is important to recognize when you are anxious or stressed and learn ways to manage it.Exercise is a great option. SEEK MEDICAL CARE IF:  You have pain that is not relieved with rest or medicine.  You have pain that does not improve in 1 week.  You have new symptoms.  You are generally not feeling well. SEEK   IMMEDIATE MEDICAL CARE IF:   You have pain that radiates from your back into your legs.  You develop new bowel or bladder control problems.  You have unusual weakness or numbness in your arms or legs.  You develop nausea or vomiting.  You develop abdominal pain.  You feel faint. Document Released: 12/06/2005 Document Revised: 06/06/2012 Document Reviewed: 04/09/2014 [employer] Patient Information 2015 [employer]. This information is not intended to replace advice given to you by your health care provider. Make sure you  discuss any questions you have with your health care provider.  

## 2015-03-02 NOTE — ED Notes (Addendum)
She [facility], had a ct scan and lab work which was normal. The pain has persisted and is now radiating into her back. Describes as a "burning back pain." she reports once she left [facility] she began to vomit green bile and has had some diarrhea also

## 2015-03-02 NOTE — ED Notes (Signed)
Pt in MRI.
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
## 2015-09-13 IMAGING — CT CT ABD-PELV W/ CM
1 of 3 series · 13 of 32 positions shown, 18 images · IV contrast (OMNIPAQUE 300)
Comparison: Lumbar spine radiographs performed 08/26/2014, and CT
of the abdomen and pelvis performed 02/14/2013

CLINICAL DATA: Sudden onset of lower back pain, radiating to the
umbilicus. Nausea and vomiting. Initial encounter.

EXAM:
CT ABDOMEN AND PELVIS WITH CONTRAST
TECHNIQUE: Multidetector CT imaging of the abdomen and pelvis was performed
using the standard protocol following bolus administration of
intravenous contrast.
CONTRAST:  100mL OMNIPAQUE IOHEXOL 300 MG/ML  SOLN

[Series 2: abd/pel with · axial · 0.74mm/px · z∈[+998,+1418]mm · 13 of 94 slices shown, 18 images]
[im 5/94  soft-tissue]
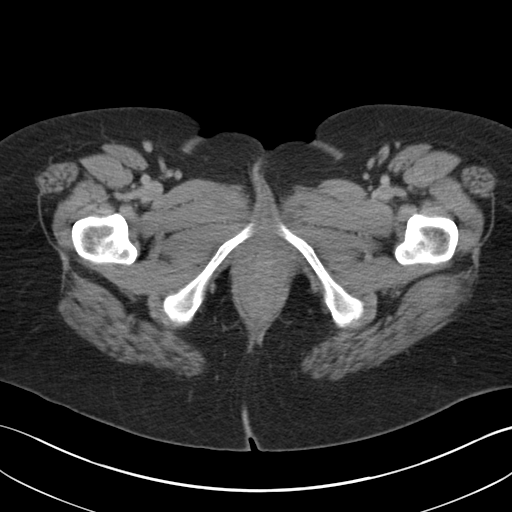
[im 5/94  bone]
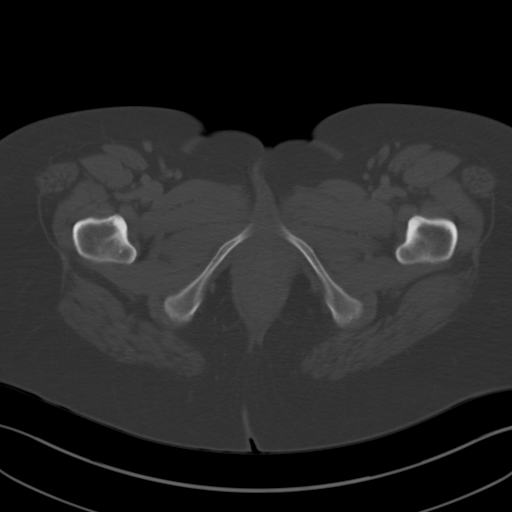
[im 14/94  soft-tissue]
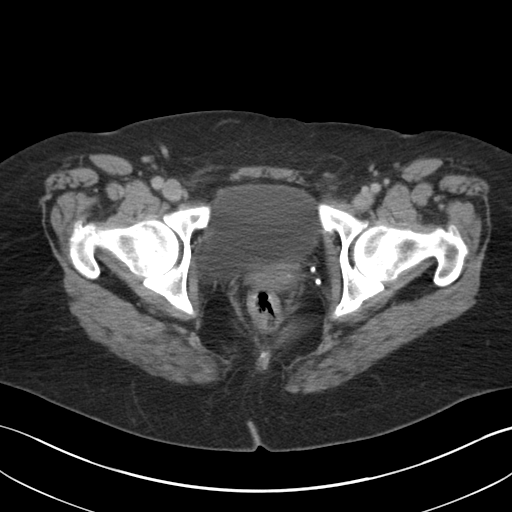
[im 19/94  soft-tissue]
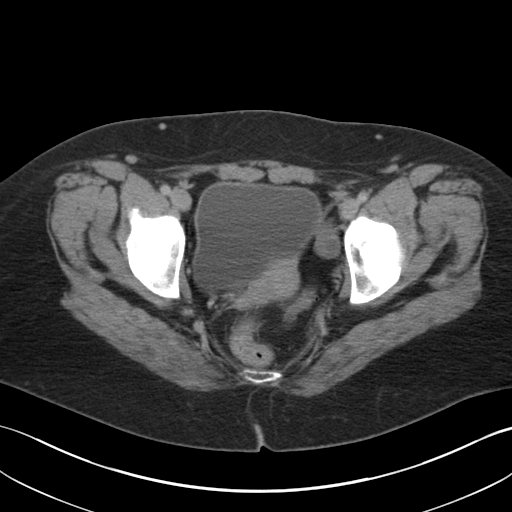
[im 28/94  soft-tissue]
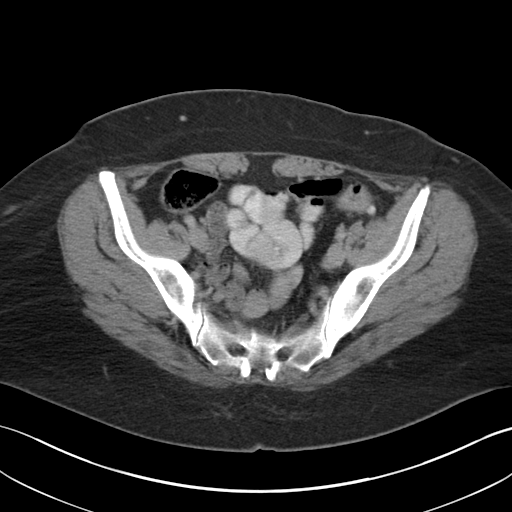
[im 38/94  soft-tissue]
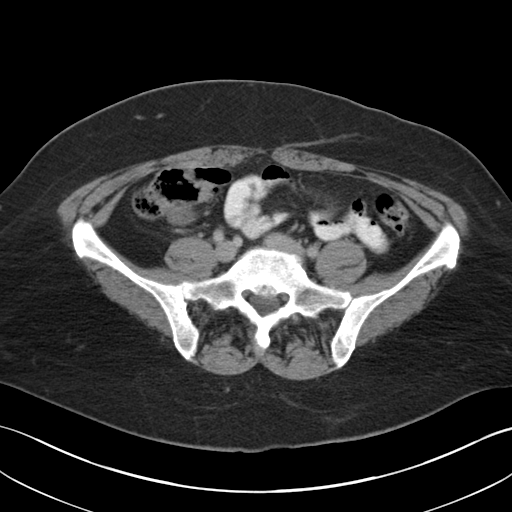
[im 42/94  soft-tissue]
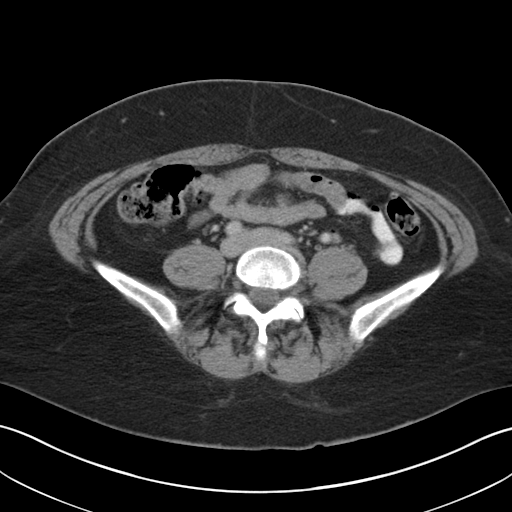
[im 52/94  soft-tissue]
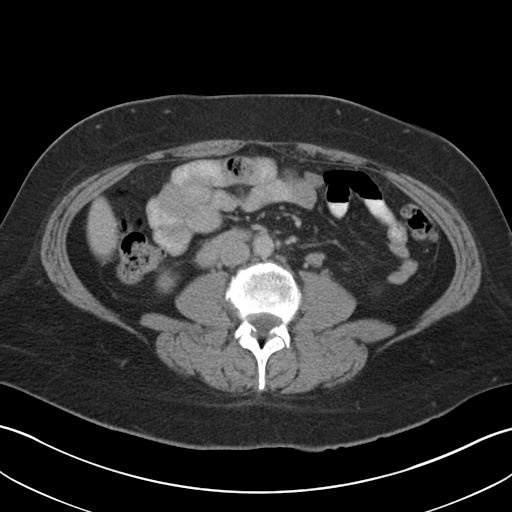
[im 56/94  soft-tissue]
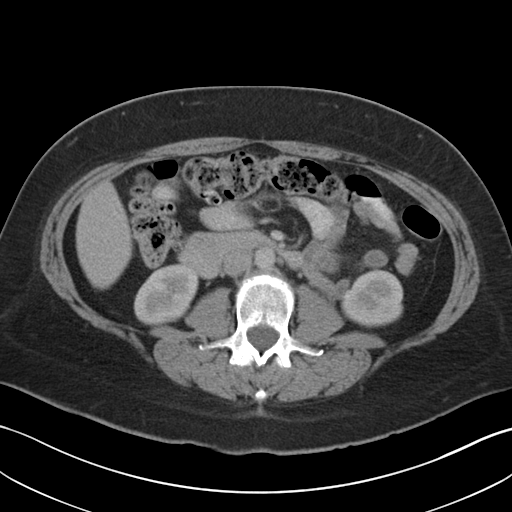
[im 66/94  soft-tissue]
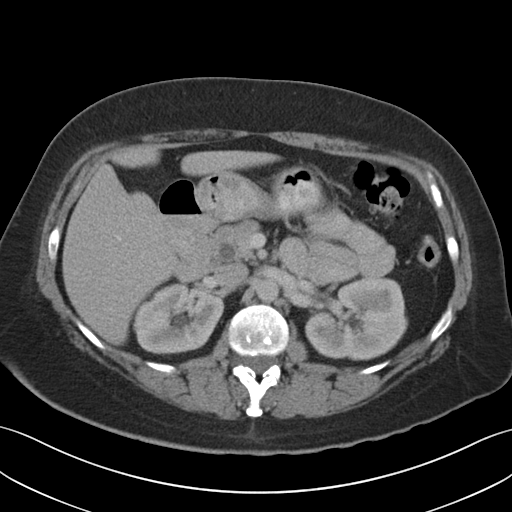
[im 66/94  bone]
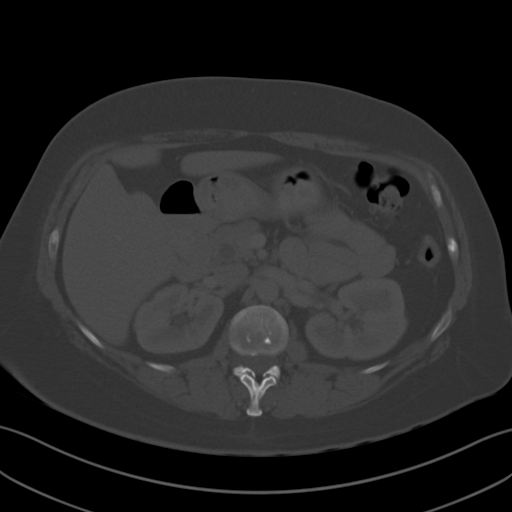
[im 75/94  soft-tissue]
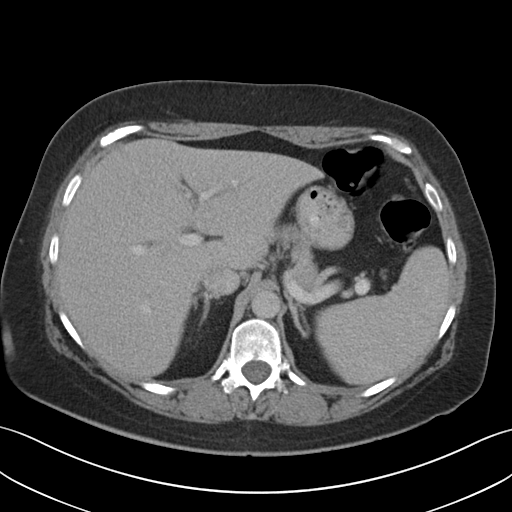
[im 75/94  lung]
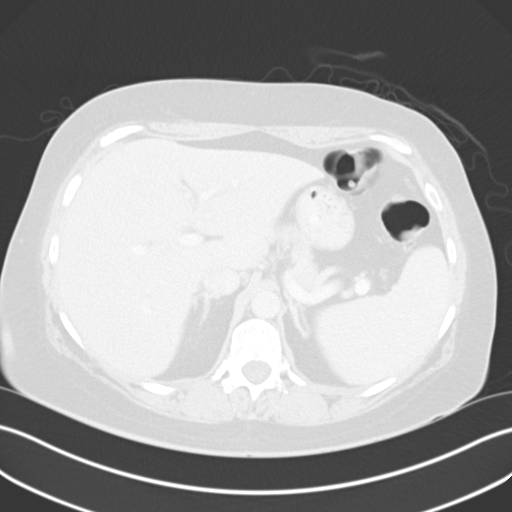
[im 80/94  soft-tissue]
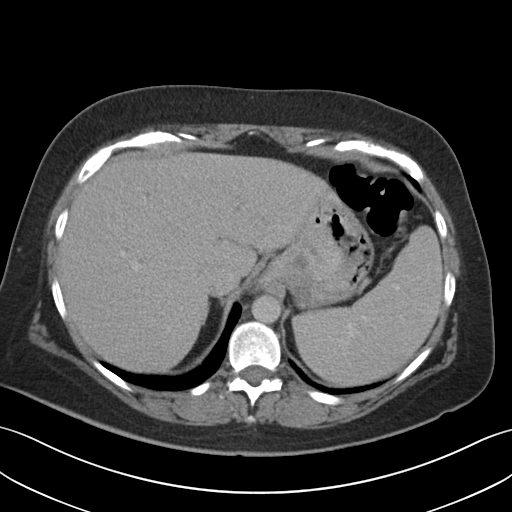
[im 80/94  lung]
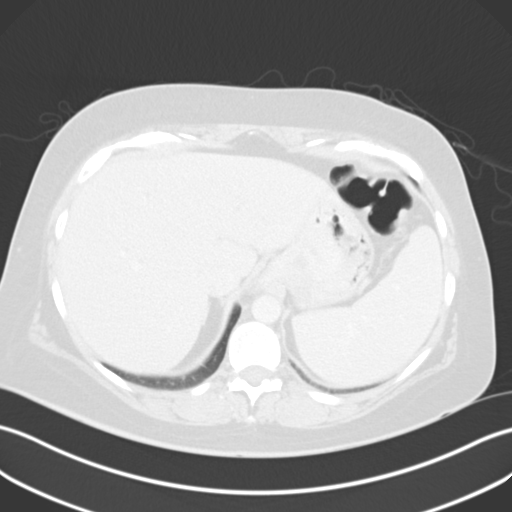
[im 84/94  lung]
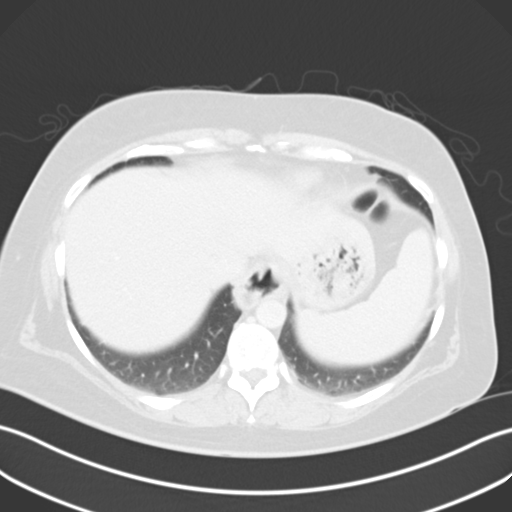
[im 89/94  soft-tissue]
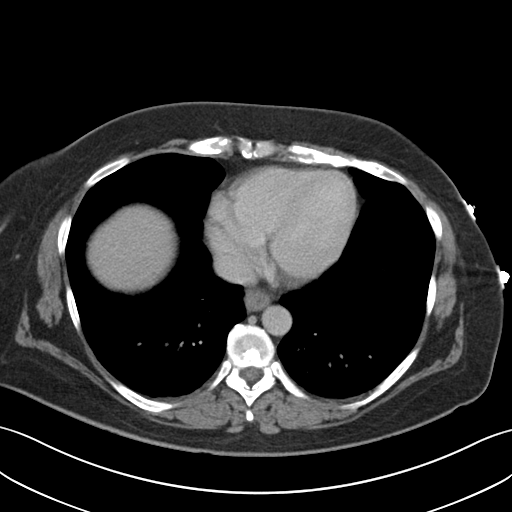
[im 89/94  lung]
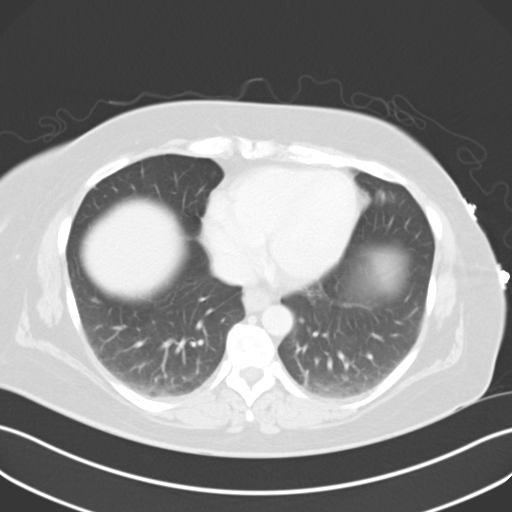

[13 of 32 positions shown; findings below may reference images not displayed]

FINDINGS: The visualized lung bases are clear.  A tiny hiatal hernia is noted.

Mild prominence of the intrahepatic biliary ducts remains within
normal limits status post cholecystectomy. The liver is otherwise
unremarkable. Clips are seen at the gallbladder fossa. The spleen is
unremarkable in appearance. The pancreas and adrenal glands are
unremarkable.

The kidneys are unremarkable in appearance. There is no evidence of
hydronephrosis. No renal or ureteral stones are seen. No perinephric
stranding is appreciated.

No free fluid is identified. The small bowel is unremarkable in
appearance. The stomach is within normal limits. No acute vascular
abnormalities are seen.

The patient is status post appendectomy. Mild diverticulosis is
noted along the entirety of the colon, without evidence of
diverticulitis. The colon is otherwise unremarkable in appearance.

The bladder is mildly distended and grossly unremarkable. The uterus
is within normal limits. The ovaries are relatively symmetric. No
suspicious adnexal masses are seen. No inguinal lymphadenopathy is
seen.

No acute osseous abnormalities are identified. There appears to be
an elongated cyst along the right side of the spinal canal at L1,
measuring approximately 3.5 x 1.9 cm. This more likely reflects a
very unusual Tarlov cyst at the lumbar spine, and is perhaps
minimally increased in size from 4662.
IMPRESSION: 1. No acute abnormality seen within the abdomen or pelvis.
2. Mild diverticulosis noted along the entirety of the colon,
without evidence of diverticulitis.
3. Apparent elongated cyst along the right side of the spinal canal
at L1, measuring approximately 3.5 x 1.9 cm. This more likely
reflects a very unusual Tarlov cyst at the lumbar spine, and is
perhaps minimally increased in size from 4662. MRI of the lumbar
spine could be considered for further evaluation, as deemed
clinically appropriate, especially of the patient is symptomatic.
4. Tiny hiatal hernia noted.
